# Patient Record
Sex: Male | Born: 1960 | Race: White | Hispanic: No | Marital: Married | Smoking: Never smoker
Health system: Southern US, Community
[De-identification: ages and names within clinical notes are randomized; demographics above are authoritative.]

## PROBLEM LIST (undated history)

## (undated) DIAGNOSIS — E785 Hyperlipidemia, unspecified: Secondary | ICD-10-CM

## (undated) DIAGNOSIS — M51369 Other intervertebral disc degeneration, lumbar region without mention of lumbar back pain or lower extremity pain: Secondary | ICD-10-CM

## (undated) DIAGNOSIS — K219 Gastro-esophageal reflux disease without esophagitis: Secondary | ICD-10-CM

## (undated) DIAGNOSIS — E559 Vitamin D deficiency, unspecified: Secondary | ICD-10-CM

## (undated) DIAGNOSIS — M109 Gout, unspecified: Secondary | ICD-10-CM

## (undated) DIAGNOSIS — M5126 Other intervertebral disc displacement, lumbar region: Secondary | ICD-10-CM

## (undated) DIAGNOSIS — M5136 Other intervertebral disc degeneration, lumbar region: Secondary | ICD-10-CM

## (undated) DIAGNOSIS — K589 Irritable bowel syndrome without diarrhea: Secondary | ICD-10-CM

## (undated) DIAGNOSIS — J309 Allergic rhinitis, unspecified: Secondary | ICD-10-CM

## (undated) HISTORY — PX: OTHER SURGICAL HISTORY: SHX169

## (undated) HISTORY — DX: Gout, unspecified: M10.9

## (undated) HISTORY — DX: Other intervertebral disc degeneration, lumbar region: M51.36

## (undated) HISTORY — DX: Irritable bowel syndrome, unspecified: K58.9

## (undated) HISTORY — DX: Gastro-esophageal reflux disease without esophagitis: K21.9

## (undated) HISTORY — DX: Allergic rhinitis, unspecified: J30.9

## (undated) HISTORY — DX: Other intervertebral disc displacement, lumbar region: M51.26

## (undated) HISTORY — DX: Hyperlipidemia, unspecified: E78.5

## (undated) HISTORY — PX: VASECTOMY: SHX75

## (undated) HISTORY — DX: Other intervertebral disc degeneration, lumbar region without mention of lumbar back pain or lower extremity pain: M51.369

## (undated) HISTORY — DX: Vitamin D deficiency, unspecified: E55.9

---

## 2003-05-15 ENCOUNTER — Ambulatory Visit (HOSPITAL_COMMUNITY): Admission: RE | Admit: 2003-05-15 | Discharge: 2003-05-16 | Payer: Self-pay | Admitting: Orthopaedic Surgery

## 2003-07-05 ENCOUNTER — Encounter: Admission: RE | Admit: 2003-07-05 | Discharge: 2003-08-20 | Payer: Self-pay | Admitting: Orthopaedic Surgery

## 2010-05-31 ENCOUNTER — Encounter: Payer: Self-pay | Admitting: Orthopedic Surgery

## 2015-10-14 DIAGNOSIS — M109 Gout, unspecified: Secondary | ICD-10-CM | POA: Diagnosis not present

## 2016-02-21 DIAGNOSIS — Z23 Encounter for immunization: Secondary | ICD-10-CM | POA: Diagnosis not present

## 2016-03-19 DIAGNOSIS — R8290 Unspecified abnormal findings in urine: Secondary | ICD-10-CM | POA: Diagnosis not present

## 2016-03-19 DIAGNOSIS — Z0000 Encounter for general adult medical examination without abnormal findings: Secondary | ICD-10-CM | POA: Diagnosis not present

## 2017-05-13 DIAGNOSIS — Z0000 Encounter for general adult medical examination without abnormal findings: Secondary | ICD-10-CM | POA: Diagnosis not present

## 2017-05-13 DIAGNOSIS — E7800 Pure hypercholesterolemia, unspecified: Secondary | ICD-10-CM | POA: Diagnosis not present

## 2017-05-13 DIAGNOSIS — Z23 Encounter for immunization: Secondary | ICD-10-CM | POA: Diagnosis not present

## 2017-05-13 DIAGNOSIS — E559 Vitamin D deficiency, unspecified: Secondary | ICD-10-CM | POA: Diagnosis not present

## 2017-05-13 DIAGNOSIS — Z125 Encounter for screening for malignant neoplasm of prostate: Secondary | ICD-10-CM | POA: Diagnosis not present

## 2017-05-13 DIAGNOSIS — R8290 Unspecified abnormal findings in urine: Secondary | ICD-10-CM | POA: Diagnosis not present

## 2019-04-13 DIAGNOSIS — Z20828 Contact with and (suspected) exposure to other viral communicable diseases: Secondary | ICD-10-CM | POA: Diagnosis not present

## 2020-01-31 LAB — CBC AND DIFFERENTIAL
HCT: 44 (ref 41–53)
Hemoglobin: 15.4 (ref 13.5–17.5)

## 2020-01-31 LAB — CBC: RBC: 5.11 (ref 3.87–5.11)

## 2020-01-31 LAB — TSH: TSH: 1.08 (ref <=5.90)

## 2020-03-03 ENCOUNTER — Encounter: Payer: Self-pay | Admitting: General Practice

## 2020-04-22 ENCOUNTER — Telehealth: Payer: Self-pay | Admitting: Radiology

## 2020-04-22 ENCOUNTER — Ambulatory Visit: Payer: 59 | Admitting: Cardiology

## 2020-04-22 ENCOUNTER — Encounter: Payer: Self-pay | Admitting: Cardiology

## 2020-04-22 ENCOUNTER — Other Ambulatory Visit: Payer: Self-pay

## 2020-04-22 VITALS — BP 110/70 | HR 69 | Ht 69.0 in | Wt 209.0 lb

## 2020-04-22 DIAGNOSIS — R002 Palpitations: Secondary | ICD-10-CM | POA: Diagnosis not present

## 2020-04-22 DIAGNOSIS — R079 Chest pain, unspecified: Secondary | ICD-10-CM

## 2020-04-22 DIAGNOSIS — Z8249 Family history of ischemic heart disease and other diseases of the circulatory system: Secondary | ICD-10-CM | POA: Diagnosis not present

## 2020-04-22 DIAGNOSIS — R072 Precordial pain: Secondary | ICD-10-CM | POA: Diagnosis not present

## 2020-04-22 MED ORDER — METOPROLOL TARTRATE 100 MG PO TABS: ORAL_TABLET | ORAL | 0 refills | Status: DC

## 2020-04-22 NOTE — Telephone Encounter (Signed)
Enrolled patient for a 30 day [employer] Event Monitor to be mailed to patients home  

## 2020-04-22 NOTE — Patient Instructions (Signed)
Medication Instructions:  Your physician recommends that you continue on your current medications as directed. Please refer to the Current Medication list given to you today.  *If you need a refill on your cardiac medications before your next appointment, please call your pharmacy*   Testing/Procedures: Your physician has requested that you have an echocardiogram. Echocardiography is a painless test that uses sound waves to create images of your heart. It provides your doctor with information about the size and shape of your heart and how well your heart's chambers and valves are working. This procedure takes approximately one hour. There are no restrictions for this procedure.  Your physician has recommended that you wear an event monitor. Event monitors are medical devices that record the heart's electrical activity. Doctors most often [place] these monitors to diagnose arrhythmias. Arrhythmias are problems with the speed or rhythm of the heartbeat. The monitor is a small, portable device. You can wear one while you do your normal daily activities. This is usually used to diagnose what is causing palpitations/syncope (passing out).  Your physician has recommended that you have a coronary CTA scan. Please see next page for further instructions.    Follow-Up: At [facility], you and your health needs are our priority.  As part of our continuing mission to provide you with exceptional heart care, we have created designated Provider Care Teams.  These Care Teams include your primary Cardiologist (physician) and Advanced Practice Providers (APPs -  Physician Assistants and Nurse Practitioners) who all work together to provide you with the care you need, when you need it.  Follow up with Dr. Radford Pax as needed based on results of testing.   Other Instructions Your cardiac CT will be scheduled at:   [facility] [address] [place], [place] [address] [phone]  Please arrive at  the [facility] main entrance of [facility] 30 minutes prior to test start time. Proceed to the [facility] Radiology Department (first floor) to check-in and test prep.   Please follow these instructions carefully (unless otherwise directed):  Hold all erectile dysfunction medications at least 3 days (72 hrs) prior to test.  On the Night Before the Test: . Be sure to Drink plenty of water. . Do not consume any caffeinated/decaffeinated beverages or chocolate 12 hours prior to your test. . Do not take any antihistamines 12 hours prior to your test.  On the Day of the Test: . Drink plenty of water. Do not drink any water within one hour of the test. . Do not eat any food 4 hours prior to the test. . You may take your regular medications prior to the test.  . Take metoprolol (Lopressor) two hours prior to test. . HOLD Furosemide/Hydrochlorothiazide morning of the test.  After the Test: . Drink plenty of water. . After receiving IV contrast, you may experience a mild flushed feeling. This is normal. . On occasion, you may experience a mild rash up to 24 hours after the test. This is not dangerous. If this occurs, you can take Benadryl 25 mg and increase your fluid intake. . If you experience trouble breathing, this can be serious. If it is severe call 911 IMMEDIATELY. If it is mild, please call our office. . If you take any of these medications: Glipizide/Metformin, Avandament, Glucavance, please do not take 48 hours after completing test unless otherwise instructed.   Once we have confirmed authorization from your insurance company, we will call you to set up a  date and time for your test. Based on how quickly your insurance processes prior authorizations requests, please allow up to 4 weeks to be contacted for scheduling your Cardiac CT appointment. Be advised that routine Cardiac CT appointments could be scheduled as many as 8 weeks after your provider has ordered it.  For  non-scheduling related questions, please contact the cardiac imaging nurse navigator should you have any questions/concerns: Marchia Bond, Cardiac Imaging Nurse Navigator Burley Saver, Interim Cardiac Imaging Nurse [place] [facility] Services Direct Office Dial: [phone]   For scheduling needs, including cancellations and rescheduling, please call [place], [phone].

## 2020-04-22 NOTE — Progress Notes (Signed)
Cardiology Office Note    Date:  04/22/2020   ID:  Spencer Medina, DOB Apr 28, 1961, MRN [id number]  PCP:  Hulan Fess, MD  Cardiologist:  Fransico Him, MD   Chief Complaint  Patient presents with  . New Patient (Initial Visit)    Chest pain and Palpitations    History of Present Illness:  Spencer Medina is a 59 y.o. male who is being seen today for the evaluation of chest pain and palpitations at the request of Little, Lennette Bihari, MD.  This is a 58yo male with a hx of GERD and HLD who is referred for evaluation of chest pain and palpitations. He tells me that he has had palpitations intermittently for the past few months.  His PCP thought it may be related to anxiety and placed him on Buspirone with some improvement in his symptoms.  Albin Fischer, though, while at work he had a severe episode of palpitations and HR jumped to 200bpm.  He felt lightheaded but no syncope.  He says that he has been under a lot of stress and thinks it may be playing a role.    He also has been having rare episodes of chest pain that he describes as a tightness over the left side but then at times feels like he has pulled a muscle.  There is no radiation of the discomfort and no associated Nausea or diaphoresis.  He denies any DOE, PND, orthopnea, LE edema.  He has never smoked but was exposed to second hand smoke growing up.  He thinks that his sister had heart valve problems and had to have surgery and thinks she has an AICD.  His mother has afib.    Past Medical History:  Diagnosis Date  . Allergic rhinitis   . DDD (degenerative disc disease), lumbar   . GERD (gastroesophageal reflux disease)   . Gout   . Hyperlipidemia   . IBS (irritable bowel syndrome)   . Lumbar herniated disc   . Vitamin D deficiency     Past Surgical History:  Procedure Laterality Date  . MICRODISCECTOMY    . VASECTOMY      Current Medications: Current Meds  Medication Sig  . atorvastatin (LIPITOR) 20 MG tablet Take 20 mg by  mouth daily.  . busPIRone (BUSPAR) 7.5 MG tablet Take 7.5 mg by mouth 2 (two) times daily.  Marland Kitchen CALCIUM CITRATE PO Take by mouth.  . Cholecalciferol (VITAMIN D3) 1.25 MG (50000 UT) TABS Take by mouth.  . Cyanocobalamin (VITAMIN B 12 PO) Take by mouth.  . fluticasone (FLONASE) 50 MCG/ACT nasal spray Place into both nostrils daily.  . Glucosamine 500 MG CAPS Take by mouth.  . vitamin E 180 MG (400 UNITS) capsule Take 400 Units by mouth daily.    Allergies:   Patient has no allergy information on record.   Social History   Socioeconomic History  . Marital status: Married    Spouse name: Not on file  . Number of children: Not on file  . Years of education: Not on file  . Highest education level: Not on file  Occupational History  . Not on file  Tobacco Use  . Smoking status: Never Smoker  . Smokeless tobacco: Never Used  Substance and Sexual Activity  . Alcohol use: Not on file  . Drug use: Not on file  . Sexual activity: Not on file  Other Topics Concern  . Not on file  Social History Narrative  . Not on file  Social Determinants of Health   Financial Resource Strain: Not on file  Food Insecurity: Not on file  Transportation Needs: Not on file  Physical Activity: Not on file  Stress: Not on file  Social Connections: Not on file     Family History:  The patient's family history includes Cancer in his father and mother; Heart Problems in his father and sister; High Cholesterol in his mother; Multiple myeloma in his father.   ROS:   Please see the history of present illness.    ROS All other systems reviewed and are negative.  No flowsheet data found.     PHYSICAL EXAM:   VS:  BP 110/70   Pulse 69   Ht $R'5\' 9"'xq$  (1.753 m)   Wt 209 lb (94.8 kg)   SpO2 97%   BMI 30.86 kg/m    GEN: Well nourished, well developed, in no acute distress  HEENT: normal  Neck: no JVD, carotid bruits, or masses Cardiac: RRR; no murmurs, rubs, or gallops,no edema.  Intact distal pulses  bilaterally.  Respiratory:  clear to auscultation bilaterally, normal work of breathing GI: soft, nontender, nondistended, + BS MS: no deformity or atrophy  Skin: warm and dry, no rash Neuro:  Alert and Oriented x 3, Strength and sensation are intact Psych: euthymic mood, full affect  Wt Readings from Last 3 Encounters:  04/22/20 209 lb (94.8 kg)      Studies/Labs Reviewed:   EKG:  EKG is ordered today.  The ekg ordered today demonstrates NSR with no ST changes  Recent Labs: No results found for requested labs within last 8760 hours.   Lipid Panel No results found for: CHOL, TRIG, HDL, CHOLHDL, VLDL, LDLCALC, LDLDIRECT Additional studies/ records that were reviewed today include:   OV notes from PCP    ASSESSMENT:    1. Chest pain of uncertain etiology   2. Palpitations   3. Family history of dilated cardiomyopathy   4. Precordial pain      PLAN:  In order of problems listed above:  1. Chest pain -atypical in some respects but he does have CRfs including second hand smoke exposure and HLD -EKG is nonischemic -I have recommended a coronary CTA to define coronary anatomy  2.  Palpitations -? Related to anxiety as Buspirone has helped some and he has been under a lot of stress -recommend 30 day event monitor to assess for arrhythmias  3.  Fm hx of DCM -his sister has an AICD -check 2D echo    Medication Adjustments/Labs and Tests Ordered: Current medicines are reviewed at length with the patient today.  Concerns regarding medicines are outlined above.  Medication changes, Labs and Tests ordered today are listed in the Patient Instructions below.  Patient Instructions  Medication Instructions:  Your physician recommends that you continue on your current medications as directed. Please refer to the Current Medication list given to you today.  *If you need a refill on your cardiac medications before your next appointment, please call your  pharmacy*   Testing/Procedures: Your physician has requested that you have an echocardiogram. Echocardiography is a painless test that uses sound waves to create images of your heart. It provides your doctor with information about the size and shape of your heart and how well your heart's chambers and valves are working. This procedure takes approximately one hour. There are no restrictions for this procedure.  Your physician has recommended that you wear an event monitor. Event monitors are medical devices that record  the heart's electrical activity. Doctors most often [place] these monitors to diagnose arrhythmias. Arrhythmias are problems with the speed or rhythm of the heartbeat. The monitor is a small, portable device. You can wear one while you do your normal daily activities. This is usually used to diagnose what is causing palpitations/syncope (passing out).  Your physician has recommended that you have a coronary CTA scan. Please see next page for further instructions.    Follow-Up: At [facility], you and your health needs are our priority.  As part of our continuing mission to provide you with exceptional heart care, we have created designated Provider Care Teams.  These Care Teams include your primary Cardiologist (physician) and Advanced Practice Providers (APPs -  Physician Assistants and Nurse Practitioners) who all work together to provide you with the care you need, when you need it.  Follow up with Dr. Radford Pax as needed based on results of testing.   Other Instructions Your cardiac CT will be scheduled at:   [facility] [address] [place], [place] [address] [phone]  Please arrive at the [facility] main entrance of [facility] 30 minutes prior to test start time. Proceed to the [facility] Radiology Department (first floor) to check-in and test prep.   Please follow these instructions carefully (unless otherwise directed):  Hold all erectile  dysfunction medications at least 3 days (72 hrs) prior to test.  On the Night Before the Test: . Be sure to Drink plenty of water. . Do not consume any caffeinated/decaffeinated beverages or chocolate 12 hours prior to your test. . Do not take any antihistamines 12 hours prior to your test.  On the Day of the Test: . Drink plenty of water. Do not drink any water within one hour of the test. . Do not eat any food 4 hours prior to the test. . You may take your regular medications prior to the test.  . Take metoprolol (Lopressor) two hours prior to test. . HOLD Furosemide/Hydrochlorothiazide morning of the test.  After the Test: . Drink plenty of water. . After receiving IV contrast, you may experience a mild flushed feeling. This is normal. . On occasion, you may experience a mild rash up to 24 hours after the test. This is not dangerous. If this occurs, you can take Benadryl 25 mg and increase your fluid intake. . If you experience trouble breathing, this can be serious. If it is severe call 911 IMMEDIATELY. If it is mild, please call our office. . If you take any of these medications: Glipizide/Metformin, Avandament, Glucavance, please do not take 48 hours after completing test unless otherwise instructed.   Once we have confirmed authorization from your insurance company, we will call you to set up a date and time for your test. Based on how quickly your insurance processes prior authorizations requests, please allow up to 4 weeks to be contacted for scheduling your Cardiac CT appointment. Be advised that routine Cardiac CT appointments could be scheduled as many as 8 weeks after your provider has ordered it.  For non-scheduling related questions, please contact the cardiac imaging nurse navigator should you have any questions/concerns: Marchia Bond, Cardiac Imaging Nurse Navigator Burley Saver, Interim Cardiac Imaging Nurse [place] and [facility] Services Direct Office  Dial: [phone]   For scheduling needs, including cancellations and rescheduling, please call [place], [phone].      Signed, Fransico Him, MD  04/22/2020 10:12 AM    [facility]  [place], [place], [place]  [address] Phone: [phone]; Fax: [phone]

## 2020-04-26 ENCOUNTER — Ambulatory Visit (INDEPENDENT_AMBULATORY_CARE_PROVIDER_SITE_OTHER): Payer: 59

## 2020-04-26 DIAGNOSIS — R079 Chest pain, unspecified: Secondary | ICD-10-CM

## 2020-04-26 DIAGNOSIS — R002 Palpitations: Secondary | ICD-10-CM

## 2020-04-26 DIAGNOSIS — R072 Precordial pain: Secondary | ICD-10-CM | POA: Diagnosis not present

## 2020-05-23 ENCOUNTER — Ambulatory Visit (HOSPITAL_COMMUNITY): Payer: 59 | Attending: Cardiovascular Disease

## 2020-05-23 ENCOUNTER — Other Ambulatory Visit: Payer: Self-pay

## 2020-05-23 DIAGNOSIS — R002 Palpitations: Secondary | ICD-10-CM | POA: Diagnosis not present

## 2020-05-23 DIAGNOSIS — R072 Precordial pain: Secondary | ICD-10-CM | POA: Diagnosis not present

## 2020-05-23 DIAGNOSIS — R079 Chest pain, unspecified: Secondary | ICD-10-CM | POA: Diagnosis not present

## 2020-05-23 LAB — ECHOCARDIOGRAM COMPLETE
Area-P 1/2: 2.71 cm2
S' Lateral: 3.1 cm

## 2020-06-05 ENCOUNTER — Telehealth (HOSPITAL_COMMUNITY): Payer: Self-pay | Admitting: *Deleted

## 2020-06-05 NOTE — Telephone Encounter (Signed)
Reaching out to patient to offer assistance regarding upcoming cardiac imaging study; pt verbalizes understanding of appt date/time, parking situation and where to check in, pre-test NPO status and medications ordered, and verified current allergies; name and call back number provided for further questions should they arise  Eleanna Theilen RN Navigator Cardiac Imaging [place] [facility] [phone] office [phone] cell  

## 2020-06-06 ENCOUNTER — Other Ambulatory Visit: Payer: Self-pay

## 2020-06-06 ENCOUNTER — Ambulatory Visit (HOSPITAL_COMMUNITY)
Admission: RE | Admit: 2020-06-06 | Discharge: 2020-06-06 | Disposition: A | Discharge status: Home / Self Care | Payer: 59 | Source: Ambulatory Visit | Attending: Cardiology | Admitting: Cardiology

## 2020-06-06 DIAGNOSIS — R072 Precordial pain: Secondary | ICD-10-CM | POA: Diagnosis not present

## 2020-06-06 MED ORDER — IOHEXOL 350 MG/ML SOLN
80.0000 mL | Freq: Once | INTRAVENOUS | Status: AC | PRN
  Administered 2020-06-06: 80 mL via INTRAVENOUS

## 2020-06-06 MED ORDER — NITROGLYCERIN 0.4 MG SL SUBL
0.8000 mg | SUBLINGUAL_TABLET | Freq: Once | SUBLINGUAL | Status: AC
  Administered 2020-06-06: 0.8 mg via SUBLINGUAL

## 2020-06-06 MED ORDER — NITROGLYCERIN 0.4 MG SL SUBL
SUBLINGUAL_TABLET | SUBLINGUAL | Status: AC
  Filled 2020-06-06: qty 2

## 2020-09-11 LAB — COMPREHENSIVE METABOLIC PANEL
Albumin: 4.5 (ref 3.5–5.0)
Calcium: 9.8 (ref 8.7–10.7)

## 2020-09-11 LAB — BASIC METABOLIC PANEL
BUN: 13 (ref 4–21)
CO2: 29 — AB (ref 13–22)
Glucose: 89
Potassium: 4.9 mEq/L (ref 3.5–5.1)

## 2020-09-11 LAB — HEPATIC FUNCTION PANEL
ALT: 23 U/L (ref 10–40)
AST: 23 (ref 14–40)

## 2021-04-17 ENCOUNTER — Other Ambulatory Visit: Payer: Self-pay | Admitting: Cardiology

## 2021-04-17 DIAGNOSIS — R002 Palpitations: Secondary | ICD-10-CM

## 2021-04-17 DIAGNOSIS — R072 Precordial pain: Secondary | ICD-10-CM

## 2021-04-17 DIAGNOSIS — R079 Chest pain, unspecified: Secondary | ICD-10-CM

## 2021-07-23 ENCOUNTER — Encounter: Payer: Self-pay | Admitting: Family Medicine

## 2021-07-31 ENCOUNTER — Other Ambulatory Visit: Payer: Self-pay

## 2021-07-31 ENCOUNTER — Encounter: Payer: Self-pay | Admitting: Family Medicine

## 2021-07-31 ENCOUNTER — Ambulatory Visit (INDEPENDENT_AMBULATORY_CARE_PROVIDER_SITE_OTHER): Payer: 59 | Admitting: Family Medicine

## 2021-07-31 VITALS — BP 110/68 | HR 88 | Temp 97.1°F | Ht 69.0 in | Wt 196.8 lb

## 2021-07-31 DIAGNOSIS — Z0000 Encounter for general adult medical examination without abnormal findings: Secondary | ICD-10-CM

## 2021-07-31 DIAGNOSIS — Z8739 Personal history of other diseases of the musculoskeletal system and connective tissue: Secondary | ICD-10-CM | POA: Diagnosis not present

## 2021-07-31 DIAGNOSIS — F419 Anxiety disorder, unspecified: Secondary | ICD-10-CM

## 2021-07-31 DIAGNOSIS — E7800 Pure hypercholesterolemia, unspecified: Secondary | ICD-10-CM | POA: Diagnosis not present

## 2021-07-31 DIAGNOSIS — Z1211 Encounter for screening for malignant neoplasm of colon: Secondary | ICD-10-CM

## 2021-07-31 MED ORDER — ATORVASTATIN CALCIUM 20 MG PO TABS: 20.0000 mg | ORAL_TABLET | Freq: Every day | ORAL | 3 refills | Status: DC

## 2021-07-31 MED ORDER — BUSPIRONE HCL 7.5 MG PO TABS: 7.5000 mg | ORAL_TABLET | Freq: Two times a day (BID) | ORAL | 2 refills | Status: DC

## 2021-07-31 NOTE — Progress Notes (Signed)
? ?Established Patient Office Visit ? ?Subjective:  ?Patient ID: Spencer Medina, male    DOB: 1960-06-10  Age: 61 y.o. MRN: [id number] ? ?CC:  ?Chief Complaint  ?Patient presents with  ? Establish Care  ?  NP Est. Care No concerns  ? ? ?HPI ?Spencer Medina presents for establishment of care and a refill for his cholesterol and anxiety medication.  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  He is taking BuSpar regularly twice daily for a few years and it is helped a great deal.  History of elevated cholesterol.  Apparently this runs in his family.  He is married.  He lives with his wife who is also my patient.  His father was a heavy smoker but died of bone cancer.  His mom died this past year at age 63 from what sounds like complications around temporal arteritis.  It sounds as though other factors were involved as well.  He was quite close to her.  History of gout with intermittent rare flares.  He takes colchicine when these occur.  Last colonoscopy was 10 years ago. ? ?Past Medical History:  ?Diagnosis Date  ? Allergic rhinitis   ? DDD (degenerative disc disease), lumbar   ? GERD (gastroesophageal reflux disease)   ? Gout   ? Hyperlipidemia   ? IBS (irritable bowel syndrome)   ? Lumbar herniated disc   ? Vitamin D deficiency   ? ? ?Past Surgical History:  ?Procedure Laterality Date  ? MICRODISCECTOMY    ? VASECTOMY    ? ? ?Family History  ?Problem Relation Age of Onset  ? High Cholesterol Mother   ? Cancer Mother   ?     BREAST  ? Cancer Father   ?     IN THE BLOOD  ? Multiple myeloma Father   ? Heart Problems Father   ? Heart Problems Sister   ? ? ?Social History  ? ?Socioeconomic History  ? Marital status: Married  ?  Spouse name: Not on file  ? Number of children: Not on file  ? Years of education: Not on file  ? Highest education level: Not on file  ?Occupational History  ? Not on file  ?Tobacco Use  ? Smoking status: Never  ? Smokeless tobacco: Never  ?Vaping Use  ? Vaping Use: Never used   ?Substance and Sexual Activity  ? Alcohol use: Not Currently  ? Drug use: Never  ? Sexual activity: Yes  ?Other Topics Concern  ? Not on file  ?Social History Narrative  ? Not on file  ? ?Social Determinants of Health  ? ?Financial Resource Strain: Not on file  ?Food Insecurity: Not on file  ?Transportation Needs: Not on file  ?Physical Activity: Not on file  ?Stress: Not on file  ?Social Connections: Not on file  ?Intimate Partner Violence: Not on file  ? ? ?Outpatient Medications Prior to Visit  ?Medication Sig Dispense Refill  ? Cholecalciferol (VITAMIN D3) 1.25 MG (50000 UT) TABS Take by mouth.    ? Cyanocobalamin (VITAMIN B 12 PO) Take by mouth.    ? fluticasone (FLONASE) 50 MCG/ACT nasal spray Place into both nostrils daily.    ? Glucosamine 500 MG CAPS Take by mouth.    ? metoprolol tartrate (LOPRESSOR) 100 MG tablet Take 1 tablet (100 mg total) two hours prior to CT scan. 1 tablet 0  ? vitamin E 180 MG (400 UNITS) capsule Take 400 Units by mouth daily.    ?  atorvastatin (LIPITOR) 20 MG tablet Take 20 mg by mouth daily.    ? busPIRone (BUSPAR) 7.5 MG tablet Take 7.5 mg by mouth 2 (two) times daily.    ? CALCIUM CITRATE PO Take by mouth. (Patient not taking: Reported on 06/06/2020)    ? ?No facility-administered medications prior to visit.  ? ? ?Allergies  ?Allergen Reactions  ? Shellfish-Derived Products Other (See Comments)  ? ? ?ROS ?Review of Systems  ?Constitutional:  Negative for diaphoresis, fatigue, fever and unexpected weight change.  ?HENT: Negative.    ?Eyes:  Negative for photophobia.  ?Respiratory: Negative.    ?Cardiovascular: Negative.   ?Gastrointestinal: Negative.   ?Endocrine: Negative for polyphagia and polyuria.  ?Genitourinary: Negative.   ?Musculoskeletal:  Negative for gait problem and joint swelling.  ?Neurological:  Negative for speech difficulty and weakness.  ? ?  ? ?  07/31/2021  ?  4:15 PM 07/31/2021  ?  3:50 PM  ?Depression screen PHQ 2/9  ?Down, Depressed, Hopeless 0 0  ?PHQ - 2  Score 0 0  ?Altered sleeping 0   ?Tired, decreased energy 0   ?Change in appetite 0   ?Feeling bad or failure about yourself  0   ?Trouble concentrating 0   ?Moving slowly or fidgety/restless 0   ?Suicidal thoughts 0   ?PHQ-9 Score 0   ?Difficult doing work/chores Not difficult at all   ? ? ? ?Objective:  ?  ?Physical Exam ?Vitals and nursing note reviewed.  ?Constitutional:   ?   General: He is not in acute distress. ?   Appearance: Normal appearance. He is not ill-appearing, toxic-appearing or diaphoretic.  ?HENT:  ?   Head: Normocephalic and atraumatic.  ?   Right Ear: External ear normal.  ?   Left Ear: External ear normal.  ?Eyes:  ?   General: No scleral icterus.    ?   Right eye: No discharge.     ?   Left eye: No discharge.  ?   Extraocular Movements: Extraocular movements intact.  ?   Conjunctiva/sclera: Conjunctivae normal.  ?Cardiovascular:  ?   Rate and Rhythm: Normal rate and regular rhythm.  ?Pulmonary:  ?   Effort: Pulmonary effort is normal.  ?   Breath sounds: Normal breath sounds.  ?Neurological:  ?   Mental Status: He is alert and oriented to person, place, and time.  ?Psychiatric:     ?   Mood and Affect: Mood normal.     ?   Behavior: Behavior normal.  ? ? ?BP 110/68 (BP Location: Left Arm, Patient Position: Sitting, Cuff Size: Normal)   Pulse 88   Temp (!) 97.1 ?F (36.2 ?C) (Temporal)   Ht 5' 9"  (1.753 m)   Wt 196 lb 12.8 oz (89.3 kg)   SpO2 96%   BMI 29.06 kg/m?  ?Wt Readings from Last 3 Encounters:  ?07/31/21 196 lb 12.8 oz (89.3 kg)  ?04/22/20 209 lb (94.8 kg)  ? ? ? ?Health Maintenance Due  ?Topic Date Due  ? HIV Screening  Never done  ? Hepatitis C Screening  Never done  ? ? ?There are no preventive care reminders to display for this patient. ? ?Lab Results  ?Component Value Date  ? TSH 1.08 01/31/2020  ? ?Lab Results  ?Component Value Date  ? WBC 6.4 01/31/2020  ? HGB 15.4 01/31/2020  ? HCT 44 01/31/2020  ? PLT 252 01/31/2020  ? ?Lab Results  ?Component Value Date  ? NA 136 (A)  09/11/2020  ?  K 4.9 09/11/2020  ? CO2 29 (A) 09/11/2020  ? BUN 13 09/11/2020  ? AST 23 09/11/2020  ? ALT 23 09/11/2020  ? ALBUMIN 4.5 09/11/2020  ? CALCIUM 9.8 09/11/2020  ? EGFR 76 09/11/2020  ? ?Lab Results  ?Component Value Date  ? CHOL 178 09/11/2020  ? ?Lab Results  ?Component Value Date  ? HDL 54 09/11/2020  ? ?Lab Results  ?Component Value Date  ? [place] 111 09/11/2020  ? ?Lab Results  ?Component Value Date  ? TRIG 71 09/11/2020  ? ?No results found for: CHOLHDL ?No results found for: HGBA1C ? ?  ?Assessment & Plan:  ? ?Problem List Items Addressed This Visit   ?None ?Visit Diagnoses   ? ? Healthcare maintenance    -  Primary  ? Relevant Orders  ? CBC  ? Comprehensive metabolic panel  ? PSA  ? Urinalysis, Routine w reflex microscopic  ? Elevated cholesterol      ? Relevant Medications  ? atorvastatin (LIPITOR) 20 MG tablet  ? Other Relevant Orders  ? Comprehensive metabolic panel  ? Lipid panel  ? Anxiety      ? Relevant Medications  ? busPIRone (BUSPAR) 7.5 MG tablet  ? History of gout      ? Relevant Orders  ? Uric acid  ? Screen for colon cancer      ? Relevant Orders  ? Ambulatory referral to Gastroenterology  ? ?  ? ? ?Meds ordered this encounter  ?Medications  ? busPIRone (BUSPAR) 7.5 MG tablet  ?  Sig: Take 1 tablet (7.5 mg total) by mouth 2 (two) times daily.  ?  Dispense:  180 tablet  ?  Refill:  2  ? atorvastatin (LIPITOR) 20 MG tablet  ?  Sig: Take 1 tablet (20 mg total) by mouth daily.  ?  Dispense:  90 tablet  ?  Refill:  3  ? ? ?Follow-up: Return in about 3 months (around 10/31/2021), or Return fasting for blood work and physical exam..  ? ? ?Libby Maw, MD ?

## 2021-08-10 ENCOUNTER — Encounter: Payer: Self-pay | Admitting: Gastroenterology

## 2021-08-11 DIAGNOSIS — Z8739 Personal history of other diseases of the musculoskeletal system and connective tissue: Secondary | ICD-10-CM | POA: Insufficient documentation

## 2021-08-11 DIAGNOSIS — M109 Gout, unspecified: Secondary | ICD-10-CM | POA: Insufficient documentation

## 2021-08-11 DIAGNOSIS — K219 Gastro-esophageal reflux disease without esophagitis: Secondary | ICD-10-CM | POA: Insufficient documentation

## 2021-08-11 DIAGNOSIS — E559 Vitamin D deficiency, unspecified: Secondary | ICD-10-CM | POA: Insufficient documentation

## 2021-08-11 DIAGNOSIS — M51369 Other intervertebral disc degeneration, lumbar region without mention of lumbar back pain or lower extremity pain: Secondary | ICD-10-CM | POA: Insufficient documentation

## 2021-08-11 DIAGNOSIS — J309 Allergic rhinitis, unspecified: Secondary | ICD-10-CM | POA: Insufficient documentation

## 2021-08-11 DIAGNOSIS — F4320 Adjustment disorder, unspecified: Secondary | ICD-10-CM | POA: Insufficient documentation

## 2021-08-11 DIAGNOSIS — M5136 Other intervertebral disc degeneration, lumbar region: Secondary | ICD-10-CM | POA: Insufficient documentation

## 2021-08-11 DIAGNOSIS — E7800 Pure hypercholesterolemia, unspecified: Secondary | ICD-10-CM | POA: Insufficient documentation

## 2021-08-19 ENCOUNTER — Ambulatory Visit (AMBULATORY_SURGERY_CENTER): Payer: 59 | Admitting: *Deleted

## 2021-08-19 VITALS — Ht 69.0 in | Wt 194.0 lb

## 2021-08-19 DIAGNOSIS — Z1211 Encounter for screening for malignant neoplasm of colon: Secondary | ICD-10-CM

## 2021-08-19 MED ORDER — CLENPIQ 10-3.5-12 MG-GM -GM/160ML PO SOLN: 1.0000 | ORAL | 0 refills | Status: DC

## 2021-08-19 NOTE — Progress Notes (Signed)
No egg or soy allergy known to patient  ?No issues known to pt with past sedation with any surgeries or procedures ?Patient denies ever being told they had issues or difficulty with intubation  ?No FH of Malignant Hyperthermia ?Pt is not on diet pills ?Pt is not on  home 02  ?Pt is not on blood thinners  ?Pt denies issues with constipation  ?No A fib or A flutter ? ?Clenpiq Coupon to pt in PV today , Code to Pharmacy and  NO PA's for preps discussed with pt In PV today  ?Discussed with pt there will be an out-of-pocket cost for prep and that varies from $0 to 70 +  dollars - pt verbalized understanding  ? ?Due to the COVID-19 pandemic we are asking patients to follow certain guidelines in PV and the [facility]   ?Pt aware of COVID protocols and [facility] guidelines  ? ?PV completed over the phone. Pt verified name, DOB, address and insurance during PV today.  ? ?Pt encouraged to call with questions or issues.  ?If pt has My chart, procedure instructions sent via My Chart   ?

## 2021-09-11 ENCOUNTER — Encounter: Payer: Self-pay | Admitting: Gastroenterology

## 2021-09-18 ENCOUNTER — Ambulatory Visit (AMBULATORY_SURGERY_CENTER): Payer: 59 | Admitting: Gastroenterology

## 2021-09-18 ENCOUNTER — Encounter: Payer: Self-pay | Admitting: Gastroenterology

## 2021-09-18 VITALS — BP 105/76 | HR 61 | Temp 97.3°F | Resp 12 | Wt 194.0 lb

## 2021-09-18 DIAGNOSIS — Z1211 Encounter for screening for malignant neoplasm of colon: Secondary | ICD-10-CM

## 2021-09-18 DIAGNOSIS — K5730 Diverticulosis of large intestine without perforation or abscess without bleeding: Secondary | ICD-10-CM

## 2021-09-18 MED ORDER — SODIUM CHLORIDE 0.9 % IV SOLN: 500.0000 mL | Freq: Once | INTRAVENOUS | Status: DC

## 2021-09-18 NOTE — Op Note (Signed)
[facility] ?Patient Name: Spencer Medina ?Procedure Date: 09/18/2021 8:55 AM ?MRN: [id number] ?Endoscopist: Gerrit Heck , MD ?Age: 61 ?Referring MD:  ?Date of Birth: [birth year] ?Gender: Male ?Account #: [id number] ?Procedure:                Colonoscopy ?Indications:              Screening for colorectal malignant neoplasm. Last  ?                          colonoscopy was 10 years ago and normal/no polyps.  ?                          He is otherwise without GI symptoms and no FHx of  ?                          colon cancer. ?Medicines:                Monitored Anesthesia Care ?Procedure:                Pre-Anesthesia Assessment: ?                          - Prior to the procedure, a History and Physical  ?                          was performed, and patient medications and  ?                          allergies were reviewed. The patient's tolerance of  ?                          previous anesthesia was also reviewed. The risks  ?                          and benefits of the procedure and the sedation  ?                          options and risks were discussed with the patient.  ?                          All questions were answered, and informed consent  ?                          was obtained. Prior Anticoagulants: The patient has  ?                          taken no previous anticoagulant or antiplatelet  ?                          agents. ASA Grade Assessment: II - A patient with  ?                          mild systemic disease. After reviewing the risks  ?  and benefits, the patient was deemed in  ?                          satisfactory condition to undergo the procedure. ?                          After obtaining informed consent, the colonoscope  ?                          was passed under direct vision. Throughout the  ?                          procedure, the patient's blood pressure, pulse, and  ?                          oxygen saturations were monitored continuously.  The  ?                          CF [id number] was introduced through the anus  ?                          and advanced to the the terminal ileum. The  ?                          colonoscopy was performed without difficulty. The  ?                          patient tolerated the procedure well. The quality  ?                          of the bowel preparation was good. The terminal  ?                          ileum, ileocecal valve, appendiceal orifice, and  ?                          rectum were photographed. ?Scope In: 9:08:58 AM ?Scope Out: 9:22:35 AM ?Scope Withdrawal Time: 0 hours 10 minutes 45 seconds  ?Total Procedure Duration: 0 hours 13 minutes 37 seconds  ?Findings:                 The perianal and digital rectal examinations were  ?                          normal. ?                          A few small-mouthed diverticula were found in the  ?                          sigmoid colon. ?                          The exam was otherwise normal throughout the  ?  remainder of the colon. ?                          The retroflexed view of the distal rectum and anal  ?                          verge was normal and showed no anal or rectal  ?                          abnormalities. ?                          The terminal ileum appeared normal. ?Complications:            No immediate complications. ?Estimated Blood Loss:     Estimated blood loss: none. ?Impression:               - Diverticulosis in the sigmoid colon. ?                          - The distal rectum and anal verge are normal on  ?                          retroflexion view. ?                          - The examined portion of the ileum was normal. ?                          - No specimens collected. ?Recommendation:           - Patient has a contact number available for  ?                          emergencies. The signs and symptoms of potential  ?                          delayed complications were discussed with the  ?                           patient. Return to normal activities tomorrow.  ?                          Written discharge instructions were provided to the  ?                          patient. ?                          - Resume previous diet. ?                          - Continue present medications. ?                          - Repeat colonoscopy in 10 years for screening  ?  purposes. ?                          - Return to GI office PRN. ?Gerrit Heck, MD ?09/18/2021 9:26:49 AM ?

## 2021-09-18 NOTE — Progress Notes (Signed)
? ?GASTROENTEROLOGY PROCEDURE H&P NOTE  ? ?Primary Care Physician: ?Libby Maw, MD ? ? ? ?Reason for Procedure:   Colon cancer screening  ? ?Plan:    Colonoscopy ? ?Patient is appropriate for endoscopic procedure(s) in the ambulatory ([facility]) setting. ? ?The nature of the procedure, as well as the risks, benefits, and alternatives were carefully and thoroughly reviewed with the patient. Ample time for discussion and questions allowed. The patient understood, was satisfied, and agreed to proceed.  ? ? ? ?HPI: ?Spencer Medina is a 61 y.o. male who presents for colonoscopy for CRC screening. ? ?Past Medical History:  ?Diagnosis Date  ? Allergic rhinitis   ? DDD (degenerative disc disease), lumbar   ? GERD (gastroesophageal reflux disease)   ? Gout   ? Hyperlipidemia   ? IBS (irritable bowel syndrome)   ? Lumbar herniated disc   ? Vitamin D deficiency   ? ? ?Past Surgical History:  ?Procedure Laterality Date  ? MICRODISCECTOMY    ? VASECTOMY    ? ? ?Prior to Admission medications   ?Medication Sig Start Date End Date Taking? Authorizing Provider  ?atorvastatin (LIPITOR) 20 MG tablet Take 1 tablet (20 mg total) by mouth daily. 07/31/21  Yes Libby Maw, MD  ?busPIRone (BUSPAR) 7.5 MG tablet Take 1 tablet (7.5 mg total) by mouth 2 (two) times daily. 07/31/21  Yes Libby Maw, MD  ?Cholecalciferol (VITAMIN D3) 1.25 MG (50000 UT) TABS Take by mouth.   Yes [provider]  ?Cyanocobalamin (VITAMIN B 12 PO) Take by mouth.   Yes [provider]  ?fluticasone (FLONASE) 50 MCG/ACT nasal spray Place into both nostrils daily.   Yes [provider]  ?Glucosamine-Chondroit-Vit C-Mn (GLUCOSAMINE 1500 COMPLEX) CAPS See admin instructions.   Yes [provider]  ?vitamin E 180 MG (400 UNITS) capsule Take 400 Units by mouth daily.   Yes [provider]  ?CALCIUM CITRATE PO Take by mouth. ?Patient not taking: Reported on 06/06/2020    [provider]   ?Glucosamine 500 MG CAPS Take by mouth. ?Patient not taking: Reported on 08/19/2021    [provider]  ?metoprolol tartrate (LOPRESSOR) 100 MG tablet Take 1 tablet (100 mg total) two hours prior to CT scan. ?Patient not taking: Reported on 08/19/2021 04/22/20   Sueanne Margarita, MD  ? ? ?Current Outpatient Medications  ?Medication Sig Dispense Refill  ? atorvastatin (LIPITOR) 20 MG tablet Take 1 tablet (20 mg total) by mouth daily. 90 tablet 3  ? busPIRone (BUSPAR) 7.5 MG tablet Take 1 tablet (7.5 mg total) by mouth 2 (two) times daily. 180 tablet 2  ? Cholecalciferol (VITAMIN D3) 1.25 MG (50000 UT) TABS Take by mouth.    ? Cyanocobalamin (VITAMIN B 12 PO) Take by mouth.    ? fluticasone (FLONASE) 50 MCG/ACT nasal spray Place into both nostrils daily.    ? Glucosamine-Chondroit-Vit C-Mn (GLUCOSAMINE 1500 COMPLEX) CAPS See admin instructions.    ? vitamin E 180 MG (400 UNITS) capsule Take 400 Units by mouth daily.    ? CALCIUM CITRATE PO Take by mouth. (Patient not taking: Reported on 06/06/2020)    ? Glucosamine 500 MG CAPS Take by mouth. (Patient not taking: Reported on 08/19/2021)    ? metoprolol tartrate (LOPRESSOR) 100 MG tablet Take 1 tablet (100 mg total) two hours prior to CT scan. (Patient not taking: Reported on 08/19/2021) 1 tablet 0  ? ?Current Facility-Administered Medications  ?Medication Dose Route Frequency Provider Last Rate Last  Admin  ? 0.9 %  sodium chloride infusion  500 mL Intravenous Once Zuly Belkin V, DO      ? ? ?Allergies as of 09/18/2021 - Review Complete 09/18/2021  ?Allergen Reaction Noted  ? Shellfish-derived products Other (See Comments) 01/31/2020  ? ? ?Family History  ?Problem Relation Age of Onset  ? High Cholesterol Mother   ? Cancer Mother   ?     BREAST  ? Cancer Father   ?     IN THE BLOOD  ? Multiple myeloma Father   ? Heart Problems Father   ? Heart Problems Sister   ? Colon cancer Neg Hx   ? Colon polyps Neg Hx   ? Esophageal cancer Neg Hx   ? Stomach cancer Neg Hx    ? Rectal cancer Neg Hx   ? ? ?Social History  ? ?Socioeconomic History  ? Marital status: Married  ?  Spouse name: Not on file  ? Number of children: Not on file  ? Years of education: Not on file  ? Highest education level: Not on file  ?Occupational History  ? Not on file  ?Tobacco Use  ? Smoking status: Never  ? Smokeless tobacco: Never  ?Vaping Use  ? Vaping Use: Never used  ?Substance and Sexual Activity  ? Alcohol use: Not Currently  ? Drug use: Never  ? Sexual activity: Yes  ?Other Topics Concern  ? Not on file  ?Social History Narrative  ? Not on file  ? ?Social Determinants of Health  ? ?Financial Resource Strain: Not on file  ?Food Insecurity: Not on file  ?Transportation Needs: Not on file  ?Physical Activity: Not on file  ?Stress: Not on file  ?Social Connections: Not on file  ?Intimate Partner Violence: Not on file  ? ? ?Physical Exam: ?Vital signs in last 24 hours: ?@BP  123/76   Pulse 88   Temp (!) 97.3 ?F (36.3 ?C)   Wt 194 lb (88 kg)   SpO2 99%   BMI 28.65 kg/m?  ?GEN: NAD ?EYE: Sclerae anicteric ?ENT: MMM ?CV: Non-tachycardic ?Pulm: CTA b/l ?GI: Soft, NT/ND ?NEURO:  Alert & Oriented x 3 ? ? ?Gerrit Heck, DO ?[facility] ? ? ?09/18/2021 8:58 AM ? ?

## 2021-09-18 NOTE — Progress Notes (Signed)
To pacu, VSS. Report to [email or web address].tb 

## 2021-09-18 NOTE — Patient Instructions (Signed)
YOU HAD AN ENDOSCOPIC PROCEDURE TODAY AT THE [facility]:   Refer to the procedure report that was given to you for any specific questions about what was found during the examination.  If the procedure report does not answer your questions, please call your gastroenterologist to clarify.  If you requested that your care partner not be given the details of your procedure findings, then the procedure report has been included in a sealed envelope for you to review at your convenience later.  YOU SHOULD EXPECT: Some feelings of bloating in the abdomen. Passage of more gas than usual.  Walking can help get rid of the air that was put into your GI tract during the procedure and reduce the bloating. If you had a lower endoscopy (such as a colonoscopy or flexible sigmoidoscopy) you may notice spotting of blood in your stool or on the toilet paper. If you underwent a bowel prep for your procedure, you may not have a normal bowel movement for a few days.  Please Note:  You might notice some irritation and congestion in your nose or some drainage.  This is from the oxygen used during your procedure.  There is no need for concern and it should clear up in a day or so.  SYMPTOMS TO REPORT IMMEDIATELY:   Following lower endoscopy (colonoscopy or flexible sigmoidoscopy):  Excessive amounts of blood in the stool  Significant tenderness or worsening of abdominal pains  Swelling of the abdomen that is new, acute  Fever of 100F or higher  For urgent or emergent issues, a gastroenterologist can be reached at any hour by calling [phone]. Do not use MyChart messaging for urgent concerns.    DIET:  We do recommend a small meal at first, but then you may proceed to your regular diet.  Drink plenty of fluids but you should avoid alcoholic beverages for 24 hours.  ACTIVITY:  You should plan to take it easy for the rest of today and you should NOT DRIVE or use heavy machinery until tomorrow (because  of the sedation medicines used during the test).    FOLLOW UP: Our staff will call the number listed on your records 48-72 hours following your procedure to check on you and address any questions or concerns that you may have regarding the information given to you following your procedure. If we do not reach you, we will leave a message.  We will attempt to reach you two times.  During this call, we will ask if you have developed any symptoms of COVID 19. If you develop any symptoms (ie: fever, flu-like symptoms, shortness of breath, cough etc.) before then, please call [phone].  If you test positive for Covid 19 in the 2 weeks post procedure, please call and report this information to us.    If any biopsies were taken you will be contacted by phone or by letter within the next 1-3 weeks.  Please call us at [phone] if you have not heard about the biopsies in 3 weeks.    SIGNATURES/CONFIDENTIALITY: You and/or your care partner have signed paperwork which will be entered into your electronic medical record.  These signatures attest to the fact that that the information above on your After Visit Summary has been reviewed and is understood.  Full responsibility of the confidentiality of this discharge information lies with you and/or your care-partner. 

## 2021-09-18 NOTE — Progress Notes (Signed)
Pt's states no medical or surgical changes since previsit or office visit.  ° °VS AS °

## 2021-09-22 ENCOUNTER — Telehealth: Payer: Self-pay

## 2021-09-22 NOTE — Telephone Encounter (Signed)
?  Follow up Call- ? ? ?  09/18/2021  ?  8:13 AM  ?Call back number  ?Post procedure Call Back phone  # [phone]  ?Permission to leave phone message Yes  ?  ? ?Patient questions: ? ?Do you have a fever, pain , or abdominal swelling? No. ?Pain Score  0 * ? ?Have you tolerated food without any problems? Yes.   ? ?Have you been able to return to your normal activities? Yes.   ? ?Do you have any questions about your discharge instructions: ?Diet   No. ?Medications  No. ?Follow up visit  No. ? ?Do you have questions or concerns about your Care? No. ? ?Actions: ?* If pain score is 4 or above: ?No action needed, pain <4. ? ? ?

## 2021-10-15 ENCOUNTER — Ambulatory Visit (INDEPENDENT_AMBULATORY_CARE_PROVIDER_SITE_OTHER): Payer: 59 | Admitting: Family Medicine

## 2021-10-15 ENCOUNTER — Encounter: Payer: Self-pay | Admitting: Family Medicine

## 2021-10-15 VITALS — BP 118/74 | HR 77 | Temp 97.6°F | Ht 69.0 in | Wt 194.6 lb

## 2021-10-15 DIAGNOSIS — Z0000 Encounter for general adult medical examination without abnormal findings: Secondary | ICD-10-CM | POA: Insufficient documentation

## 2021-10-15 DIAGNOSIS — E7800 Pure hypercholesterolemia, unspecified: Secondary | ICD-10-CM

## 2021-10-15 DIAGNOSIS — Z8739 Personal history of other diseases of the musculoskeletal system and connective tissue: Secondary | ICD-10-CM | POA: Diagnosis not present

## 2021-10-15 DIAGNOSIS — K649 Unspecified hemorrhoids: Secondary | ICD-10-CM

## 2021-10-15 LAB — URINALYSIS, ROUTINE W REFLEX MICROSCOPIC
Bilirubin Urine: NEGATIVE
Hgb urine dipstick: NEGATIVE
Ketones, ur: NEGATIVE
Leukocytes,Ua: NEGATIVE
Nitrite: NEGATIVE
RBC / HPF: NONE SEEN (ref 0–?)
Specific Gravity, Urine: <=1.005 — AB (ref 1.000–1.030)
Total Protein, Urine: NEGATIVE
Urine Glucose: NEGATIVE
Urobilinogen, UA: 0.2 (ref 0.0–1.0)
WBC, UA: NONE SEEN (ref 0–?)
pH: 6.5 (ref 5.0–8.0)

## 2021-10-15 LAB — CBC
HCT: 43.9 % (ref 39.0–52.0)
Hemoglobin: 14.8 g/dL (ref 13.0–17.0)
MCHC: 33.8 g/dL (ref 30.0–36.0)
MCV: 86.1 fl (ref 78.0–100.0)
Platelets: 202 10*3/uL (ref 150.0–400.0)
RBC: 5.1 Mil/uL (ref 4.22–5.81)
RDW: 13.1 % (ref 11.5–15.5)
WBC: 4.9 10*3/uL (ref 4.0–10.5)

## 2021-10-15 LAB — COMPREHENSIVE METABOLIC PANEL
ALT: 20 U/L (ref 0–53)
AST: 20 U/L (ref 0–37)
Albumin: 4.4 g/dL (ref 3.5–5.2)
Alkaline Phosphatase: 45 U/L (ref 39–117)
BUN: 15 mg/dL (ref 6–23)
CO2: 24 mEq/L (ref 19–32)
Calcium: 9.4 mg/dL (ref 8.4–10.5)
Chloride: 103 mEq/L (ref 96–112)
Creatinine, Ser: 0.98 mg/dL (ref 0.40–1.50)
GFR: 83.89 mL/min (ref >60.00)
Glucose, Bld: 89 mg/dL (ref 70–99)
Potassium: 4.2 mEq/L (ref 3.5–5.1)
Sodium: 136 mEq/L (ref 135–145)
Total Bilirubin: 0.9 mg/dL (ref 0.2–1.2)
Total Protein: 7.8 g/dL (ref 6.0–8.3)

## 2021-10-15 LAB — LIPID PANEL
Cholesterol: 182 mg/dL (ref 0–200)
HDL: 53.8 mg/dL (ref >39.00)
LDL Cholesterol: 109 mg/dL — ABNORMAL HIGH (ref 0–99)
NonHDL: 128.48
Total CHOL/HDL Ratio: 3
Triglycerides: 95 mg/dL (ref 0.0–149.0)
VLDL: 19 mg/dL (ref 0.0–40.0)

## 2021-10-15 LAB — URIC ACID: Uric Acid, Serum: 8.3 mg/dL — ABNORMAL HIGH (ref 4.0–7.8)

## 2021-10-15 LAB — PSA: PSA: 0.51 ng/mL (ref 0.10–4.00)

## 2021-10-15 MED ORDER — HYDROCORTISONE ACE-PRAMOXINE 1-1 % EX CREA: 1.0000 "application " | TOPICAL_CREAM | Freq: Two times a day (BID) | CUTANEOUS | 0 refills | Status: DC

## 2021-10-15 NOTE — Progress Notes (Addendum)
Established Patient Office Visit  Subjective   Patient ID: Spencer Medina, male    DOB: 12/16/1960  Age: 61 y.o. MRN: [id number]  Chief Complaint  Patient presents with   Annual Exam    CPE/Labs.  Fasting today.  No concerns.     HPI presents for physical exam and is fasting.  Doing well.  Exercising regularly does all a lot of walking at work.  Achieves up to 15,000 steps daily.  Has regular dental and eye care.  He is married and in a stable relationship.  Recent colonoscopy was essentially normal with a few diverticula.  He is concerned about a possible hemorrhoid.  Denies constipation or prolonged sitting on the toilet bowl.    Review of Systems  Constitutional: Negative.   HENT: Negative.    Eyes:  Negative for blurred vision, discharge and redness.  Respiratory: Negative.    Cardiovascular: Negative.   Gastrointestinal:  Negative for abdominal pain.  Genitourinary: Negative.   Musculoskeletal: Negative.  Negative for myalgias.  Skin:  Negative for rash.  Neurological:  Negative for tingling, loss of consciousness and weakness.  Endo/Heme/Allergies:  Negative for polydipsia.      Objective:     BP 118/74   Pulse 77   Temp 97.6 F (36.4 C) (Temporal)   Ht 5\' 9"  (1.753 m)   Wt 194 lb 9.6 oz (88.3 kg)   SpO2 97%   BMI 28.74 kg/m    Physical Exam Constitutional:      General: He is not in acute distress.    Appearance: Normal appearance. He is not ill-appearing, toxic-appearing or diaphoretic.  HENT:     Head: Normocephalic and atraumatic.     Right Ear: External ear normal.     Left Ear: External ear normal.     Mouth/Throat:     Mouth: Mucous membranes are moist.     Pharynx: Oropharynx is clear. No oropharyngeal exudate or posterior oropharyngeal erythema.  Eyes:     General: No scleral icterus.       Right eye: No discharge.        Left eye: No discharge.     Extraocular Movements: Extraocular movements intact.     Conjunctiva/sclera: Conjunctivae  normal.     Pupils: Pupils are equal, round, and reactive to light.  Cardiovascular:     Rate and Rhythm: Normal rate and regular rhythm.  Pulmonary:     Effort: Pulmonary effort is normal. No respiratory distress.     Breath sounds: Normal breath sounds.  Abdominal:     General: Bowel sounds are normal.     Tenderness: There is no abdominal tenderness. There is no guarding.     Hernia: There is no hernia in the left inguinal area or right inguinal area.  Genitourinary:    Penis: No hypospadias, erythema, tenderness, discharge, swelling or lesions.      Testes:        Right: Mass, tenderness or swelling not present. Right testis is descended.        Left: Mass, tenderness or swelling not present. Left testis is descended.     Epididymis:     Right: Not inflamed or enlarged.     Left: Not inflamed or enlarged.    Musculoskeletal:     Cervical back: No rigidity or tenderness.  Lymphadenopathy:     Lower Body: No right inguinal adenopathy. No left inguinal adenopathy.  Skin:    General: Skin is warm and dry.  Neurological:  Mental Status: He is alert and oriented to person, place, and time.  Psychiatric:        Mood and Affect: Mood normal.        Behavior: Behavior normal.      No results found for any visits on 10/15/21.    The 10-year ASCVD risk score (Arnett DK, et al., 2019) is: 6.4%* (Cholesterol units were assumed)    Assessment & Plan:   Problem List Items Addressed This Visit       Cardiovascular and Mediastinum   Hemorrhoids   Relevant Medications   pramoxine-hydrocortisone (PROCTOCREAM-HC) 1-1 % rectal cream     Other   History of gout   Relevant Orders   Comprehensive metabolic panel   Uric acid   Elevated cholesterol   Relevant Orders   Comprehensive metabolic panel   Lipid panel   Healthcare maintenance - Primary   Relevant Orders   CBC   PSA   Urinalysis, Routine w reflex microscopic    Return in about 1 year (around 10/16/2022), or if  symptoms worsen or fail to improve.  Information was given about health maintenance and disease prevention as well as hemorrhoids.  We used ProctoCream twice daily.  He will follow-up if not improving within the next 2 weeks.  Avoid sitting on the toilet bowl for long periods of time.  Use a stool softener as needed. Information about a low purine diet added.     Mliss Sax, MD

## 2022-04-25 IMAGING — CT CT HEART MORP W/ CTA COR W/ SCORE W/ CA W/CM &/OR W/O CM
4 of 7 series · 8 of 20 positions shown, 9 images · IV contrast (omnipaque)
Comparison: None
COMPARISON: None

Addendum:
EXAM:
OVER-READ INTERPRETATION  CT CHEST

The following report is an over-read performed by radiologist Dr.
Maierhofer Sijak [[email or web address]] on 06/06/2020. This over-read
does not include interpretation of cardiac or coronary anatomy or
pathology. The coronary CTA interpretation by the cardiologist is
attached.
HISTORY: Chest pain, nonspecific
Cardiac/Coronary  CT
TECHNIQUE: The patient was scanned on a Siemens Force scanner.
PROTOCOL: A 120 kV prospective scan was triggered in the descending thoracic
aorta at 111 HU's. Axial non-contrast 3 mm slices were carried out
through the heart. The data set was analyzed on a dedicated work
station and scored using the Agatston method. Gantry rotation speed
was 250 msecs and collimation was .6 mm. Beta blockade and 0.8 mg of
sl NTG was given. The 3D data set was reconstructed in 5% intervals
of the 67-82 % of the R-R cycle. Diastolic phases were analyzed on a
dedicated work station using MPR, MIP and VRT modes. The patient
received 80mL OMNIPAQUE IOHEXOL 350 MG/ML SOLN of contrast.

[Series 6: best diast · axial · 0.39mm/px · z∈[+1191,+1228]mm · 2 of 276 slices shown, 3 images]
[im 92/276  vessel]
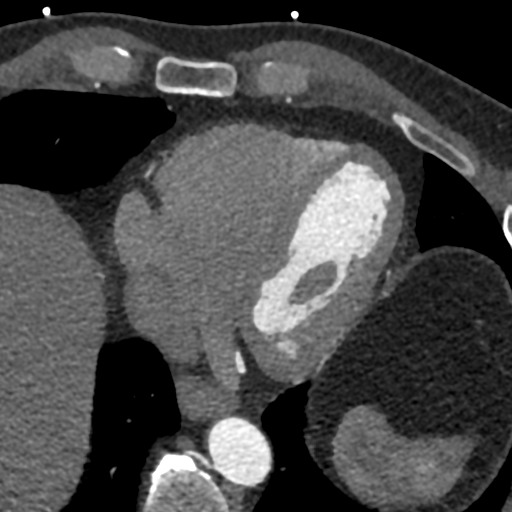
[im 92/276  lung]
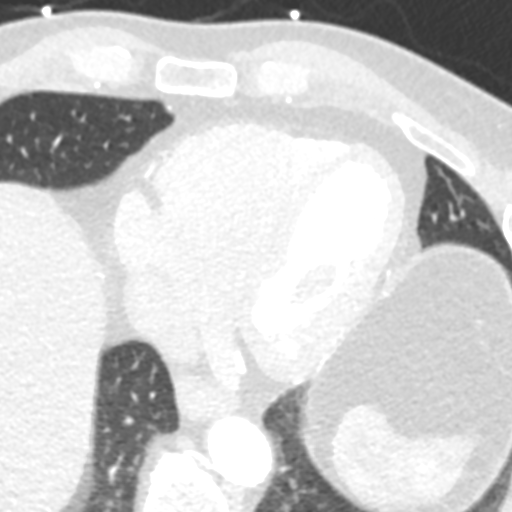
[im 184/276  vessel]
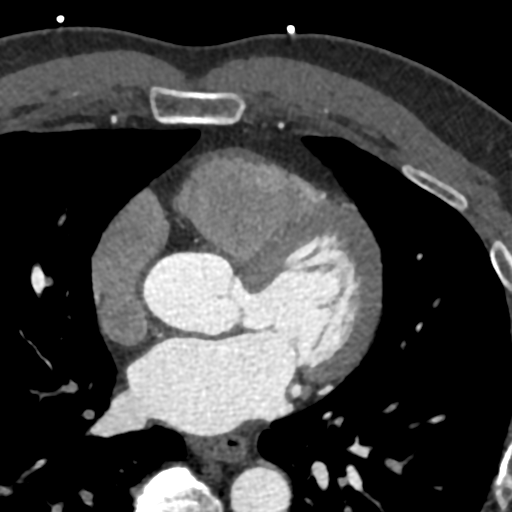

[Series 7: best syst · axial · 0.39mm/px · z∈[+1191,+1228]mm · 2 of 276 slices shown]
[im 92/276  vessel]
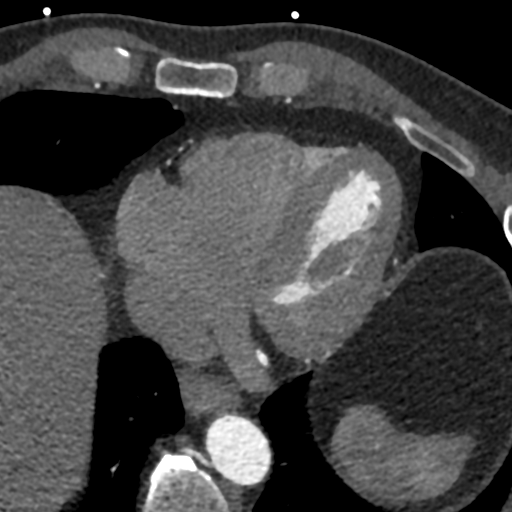
[im 184/276  vessel]
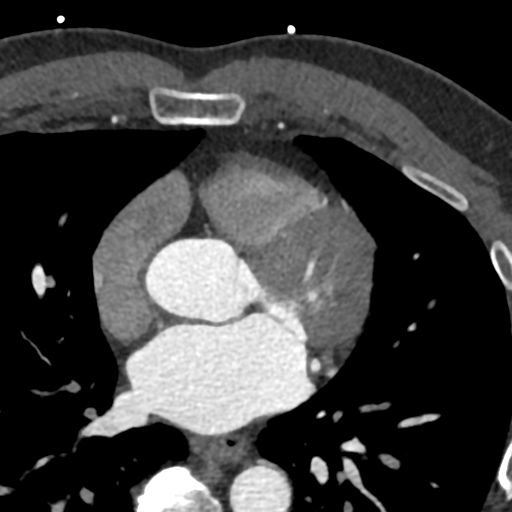

[Series 8: ts diast sharp 75 % · axial · 0.39mm/px · z∈[+1191,+1228]mm · 2 of 276 slices shown]
[im 92/276  lung]
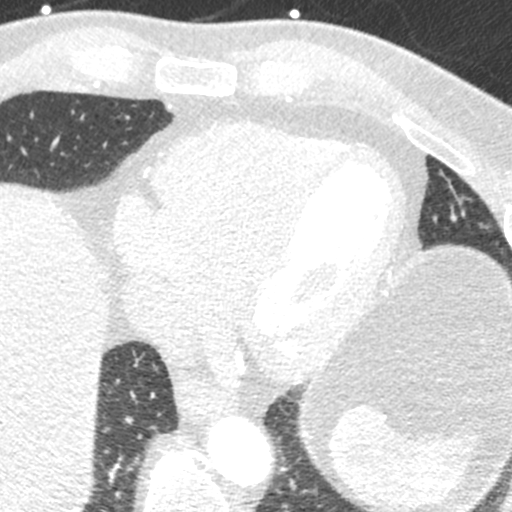
[im 184/276  lung]
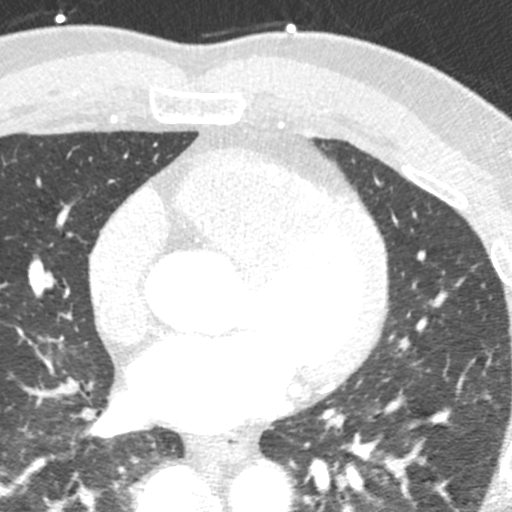

[Series 9: ts syst sharp · axial · 0.39mm/px · z∈[+1191,+1228]mm · 2 of 276 slices shown]
[im 92/276  lung]
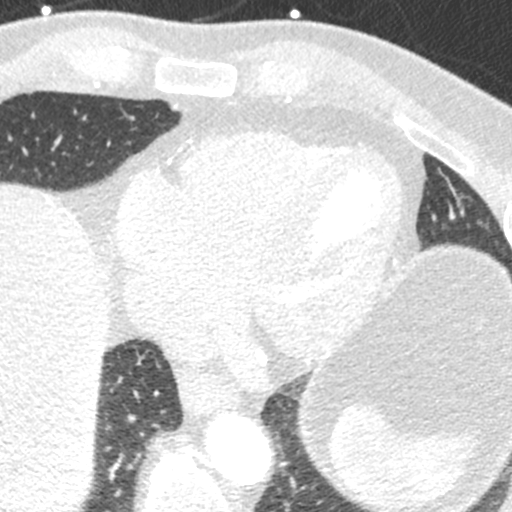
[im 184/276  lung]
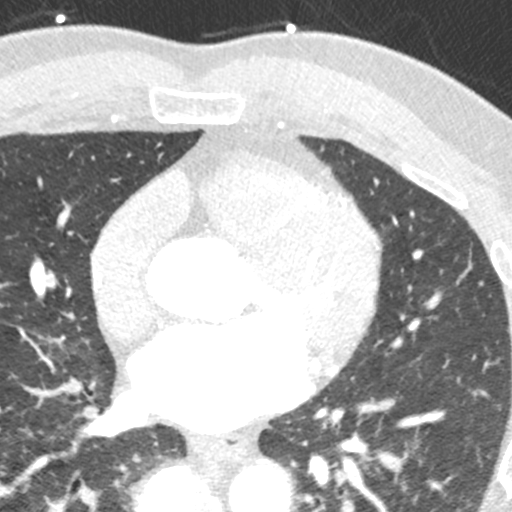

[8 of 20 positions shown; findings below may reference images not displayed]

FINDINGS: Vascular: Heart is normal size.  Aorta normal caliber.

Mediastinum/Nodes: No adenopathy

Lungs/Pleura: No confluent opacities or effusions.

Upper Abdomen: Imaging into the upper abdomen demonstrates no acute
findings.

Musculoskeletal: Chest wall soft tissues are unremarkable. No acute
bony abnormality.
IMPRESSION: No acute or significant extracardiac abnormality.
FINDINGS: Image quality: Good.

Noise artifact is: Limited.

Coronary calcium score is 0.

Coronary arteries: Normal coronary origins.  Left dominance.

Right Coronary Artery: No detectable plaque or stenosis. Small
caliber mid and distal RCA, nondominant.

Left Main Coronary Artery: No detectable plaque or stenosis.

Left Anterior Descending Coronary Artery: No detectable plaque or
stenosis. Mild superficial bridging in the mid-distal LAD.

Left Circumflex Artery: No detectable plaque or stenosis. Large left
circumflex with dominant circulation.

Aorta: Normal size, 29 mm at the mid ascending aorta (level of the
PA bifurcation) measured double oblique. No calcifications. No
dissection.

Aortic Valve: No calcifications.  Tricuspid aortic valve.

Other findings:

Normal pulmonary vein drainage into the left atrium.

Normal left atrial appendage without a thrombus.

Normal size of the pulmonary artery.
IMPRESSION: 1. No evidence of CAD, CADRADS = 0.

2. Coronary calcium score of 0.

3. Normal coronary origin with left dominance.

*** End of Addendum ***
EXAM:
OVER-READ INTERPRETATION  CT CHEST

The following report is an over-read performed by radiologist Dr.
Maierhofer Sijak [REDACTED] on 06/06/2020. This over-read
does not include interpretation of cardiac or coronary anatomy or
pathology. The coronary CTA interpretation by the cardiologist is
attached.
FINDINGS: Vascular: Heart is normal size.  Aorta normal caliber.

Mediastinum/Nodes: No adenopathy

Lungs/Pleura: No confluent opacities or effusions.

Upper Abdomen: Imaging into the upper abdomen demonstrates no acute
findings.

Musculoskeletal: Chest wall soft tissues are unremarkable. No acute
bony abnormality.
IMPRESSION: No acute or significant extracardiac abnormality.

## 2022-05-17 ENCOUNTER — Other Ambulatory Visit: Payer: Self-pay | Admitting: Family Medicine

## 2022-05-17 DIAGNOSIS — F419 Anxiety disorder, unspecified: Secondary | ICD-10-CM

## 2022-08-11 ENCOUNTER — Other Ambulatory Visit: Payer: Self-pay | Admitting: Family Medicine

## 2022-08-11 DIAGNOSIS — E7800 Pure hypercholesterolemia, unspecified: Secondary | ICD-10-CM

## 2022-10-20 ENCOUNTER — Encounter: Payer: Self-pay | Admitting: Family Medicine

## 2022-10-20 ENCOUNTER — Ambulatory Visit (INDEPENDENT_AMBULATORY_CARE_PROVIDER_SITE_OTHER): Payer: 59 | Admitting: Family Medicine

## 2022-10-20 VITALS — BP 122/80 | HR 86 | Temp 98.1°F | Ht 69.0 in | Wt 203.0 lb

## 2022-10-20 DIAGNOSIS — F419 Anxiety disorder, unspecified: Secondary | ICD-10-CM | POA: Insufficient documentation

## 2022-10-20 DIAGNOSIS — Z0000 Encounter for general adult medical examination without abnormal findings: Secondary | ICD-10-CM | POA: Diagnosis not present

## 2022-10-20 DIAGNOSIS — M5136 Other intervertebral disc degeneration, lumbar region: Secondary | ICD-10-CM | POA: Diagnosis not present

## 2022-10-20 DIAGNOSIS — Z8739 Personal history of other diseases of the musculoskeletal system and connective tissue: Secondary | ICD-10-CM

## 2022-10-20 DIAGNOSIS — E7800 Pure hypercholesterolemia, unspecified: Secondary | ICD-10-CM

## 2022-10-20 LAB — COMPREHENSIVE METABOLIC PANEL
ALT: 28 U/L (ref 0–53)
AST: 27 U/L (ref 0–37)
Albumin: 4.5 g/dL (ref 3.5–5.2)
Alkaline Phosphatase: 42 U/L (ref 39–117)
BUN: 15 mg/dL (ref 6–23)
CO2: 27 mEq/L (ref 19–32)
Calcium: 9.6 mg/dL (ref 8.4–10.5)
Chloride: 102 mEq/L (ref 96–112)
Creatinine, Ser: 1.07 mg/dL (ref 0.40–1.50)
GFR: 74.96 mL/min (ref >60.00)
Glucose, Bld: 92 mg/dL (ref 70–99)
Potassium: 5 mEq/L (ref 3.5–5.1)
Sodium: 136 mEq/L (ref 135–145)
Total Bilirubin: 1 mg/dL (ref 0.2–1.2)
Total Protein: 8.2 g/dL (ref 6.0–8.3)

## 2022-10-20 LAB — LIPID PANEL
Cholesterol: 169 mg/dL (ref 0–200)
HDL: 48.7 mg/dL (ref >39.00)
LDL Cholesterol: 101 mg/dL — ABNORMAL HIGH (ref 0–99)
NonHDL: 120.06
Total CHOL/HDL Ratio: 3
Triglycerides: 94 mg/dL (ref 0.0–149.0)
VLDL: 18.8 mg/dL (ref 0.0–40.0)

## 2022-10-20 LAB — CBC
HCT: 44 % (ref 39.0–52.0)
Hemoglobin: 14.6 g/dL (ref 13.0–17.0)
MCHC: 33.3 g/dL (ref 30.0–36.0)
MCV: 86.6 fl (ref 78.0–100.0)
Platelets: 238 10*3/uL (ref 150.0–400.0)
RBC: 5.08 Mil/uL (ref 4.22–5.81)
RDW: 13 % (ref 11.5–15.5)
WBC: 4.8 10*3/uL (ref 4.0–10.5)

## 2022-10-20 LAB — URINALYSIS, ROUTINE W REFLEX MICROSCOPIC
Bilirubin Urine: NEGATIVE
Hgb urine dipstick: NEGATIVE
Ketones, ur: NEGATIVE
Leukocytes,Ua: NEGATIVE
Nitrite: NEGATIVE
Specific Gravity, Urine: 1.01 (ref 1.000–1.030)
Total Protein, Urine: NEGATIVE
Urine Glucose: NEGATIVE
Urobilinogen, UA: 0.2 (ref 0.0–1.0)
WBC, UA: NONE SEEN (ref 0–?)
pH: 6.5 (ref 5.0–8.0)

## 2022-10-20 LAB — URIC ACID: Uric Acid, Serum: 8.7 mg/dL — ABNORMAL HIGH (ref 4.0–7.8)

## 2022-10-20 LAB — PSA: PSA: 0.55 ng/mL (ref 0.10–4.00)

## 2022-10-20 NOTE — Progress Notes (Signed)
Established Patient Office Visit   Subjective:  Patient ID: Spencer Medina, male    DOB: [birth year]  Age: 62 y.o. MRN: [id number]  Chief Complaint  Patient presents with   Annual Exam    CPE, no concerns. Patient fasting     HPI Encounter Diagnoses  Name Primary?   Elevated cholesterol Yes   Anxiety    History of gout    Healthcare maintenance    Degeneration of lumbar intervertebral disc    Continues to do well.  Quite active on his job.  Still achieves 10-15,000 steps daily.  Has regular dental care.  He lives at home with his wife.  He has 1 son who is 81 years old.  He is seeing a chiropractor lower back pain.  This is helping.  Has not had any gouty attacks over this past year.  He has been using a homeopathic supplement to lower his uric acid.  He has been on a low purine diet.  Continues with BuSpar.  He is interested in possibly trying a taper to see how things will go without it.  Continues to do well with atorvastatin.  His mother also had high cholesterol.  His 10-year risk score has been low.  Up-to-date on health maintenance.   Review of Systems  Constitutional: Negative.   HENT: Negative.    Eyes:  Negative for blurred vision, discharge and redness.  Respiratory: Negative.    Cardiovascular: Negative.   Gastrointestinal:  Negative for abdominal pain.  Genitourinary: Negative.   Musculoskeletal:  Positive for back pain. Negative for myalgias.  Skin:  Negative for rash.  Neurological:  Negative for tingling, loss of consciousness and weakness.  Endo/Heme/Allergies:  Negative for polydipsia.      10/20/2022    8:00 AM 10/15/2021    8:02 AM 07/31/2021    4:15 PM  Depression screen PHQ 2/9  Decreased Interest 0 0   Down, Depressed, Hopeless 0 0 0  PHQ - 2 Score 0 0 0  Altered sleeping 0  0  Tired, decreased energy 0  0  Change in appetite 0  0  Feeling bad or failure about yourself  0  0  Trouble concentrating 0  0  Moving slowly or fidgety/restless 0  0   Suicidal thoughts 0  0  PHQ-9 Score 0  0  Difficult doing work/chores Not difficult at all  Not difficult at all       Current Outpatient Medications:    atorvastatin (LIPITOR) 20 MG tablet, TAKE 1 TABLET BY MOUTH EVERY DAY, Disp: 30 tablet, Rfl: 11   busPIRone (BUSPAR) 7.5 MG tablet, TAKE 1 TABLET BY MOUTH 2 TIMES DAILY., Disp: 180 tablet, Rfl: 1   Cholecalciferol (VITAMIN D3) 1.25 MG (50000 UT) TABS, Take by mouth., Disp: , Rfl:    colchicine 0.6 MG tablet, , Disp: , Rfl:    Cyanocobalamin (VITAMIN B 12 PO), Take by mouth., Disp: , Rfl:    diphenhydrAMINE HCl, Sleep, (SLEEP-AID) 50 MG CAPS, , Disp: , Rfl:    fluticasone (FLONASE) 50 MCG/ACT nasal spray, Place into both nostrils daily., Disp: , Rfl:    Misc Natural Products (JOINT HEALTH) CAPS, , Disp: , Rfl:    Saw Palmetto 500 MG CAPS, , Disp: , Rfl:    Objective:     BP 122/80 (BP Location: Left Arm, Patient Position: Sitting, Cuff Size: Normal)   Pulse 86   Temp 98.1 F (36.7 C) (Temporal)   Ht 5\' 9"  (1.753 m)  Wt 203 lb (92.1 kg)   SpO2 97%   BMI 29.98 kg/m    Physical Exam Constitutional:      General: He is not in acute distress.    Appearance: Normal appearance. He is not ill-appearing, toxic-appearing or diaphoretic.  HENT:     Head: Normocephalic and atraumatic.     Right Ear: Tympanic membrane, ear canal and external ear normal.     Left Ear: Tympanic membrane, ear canal and external ear normal.     Mouth/Throat:     Mouth: Mucous membranes are moist.     Pharynx: Oropharynx is clear. No oropharyngeal exudate or posterior oropharyngeal erythema.  Eyes:     General: No scleral icterus.       Right eye: No discharge.        Left eye: No discharge.     Extraocular Movements: Extraocular movements intact.     Conjunctiva/sclera: Conjunctivae normal.     Pupils: Pupils are equal, round, and reactive to light.  Cardiovascular:     Rate and Rhythm: Normal rate and regular rhythm.  Pulmonary:     Effort:  Pulmonary effort is normal. No respiratory distress.     Breath sounds: Normal breath sounds.  Abdominal:     General: Bowel sounds are normal.     Tenderness: There is no abdominal tenderness. There is no guarding or rebound.     Hernia: No hernia is present.  Musculoskeletal:     Cervical back: No rigidity or tenderness.  Lymphadenopathy:     Cervical: No cervical adenopathy.  Skin:    General: Skin is warm and dry.  Neurological:     Mental Status: He is alert and oriented to person, place, and time.  Psychiatric:        Mood and Affect: Mood normal.        Behavior: Behavior normal.      No results found for any visits on 10/20/22.    The 10-year ASCVD risk score (Arnett DK, et al., 2019) is: 7.6%    Assessment & Plan:   Elevated cholesterol -     Comprehensive metabolic panel -     Lipid panel  Anxiety  History of gout -     Comprehensive metabolic panel -     Uric acid  Healthcare maintenance -     CBC -     PSA -     Urinalysis, Routine w reflex microscopic  Degeneration of lumbar intervertebral disc    No follow-ups on file.  Continue active healthy lifestyle.  Information given on health maintenance and disease prevention.  Given information about managing hyperlipidemia.  Will continue atorvastatin.  Information given about a low purine diet.  Will send in colchicine as needed.  Discussed using allopurinol with increasing frequency of attacks.  Discussed BuSpar taper over 6 weeks.  Mliss Sax, MD

## 2022-11-05 ENCOUNTER — Other Ambulatory Visit: Payer: Self-pay | Admitting: Family Medicine

## 2022-11-05 DIAGNOSIS — F419 Anxiety disorder, unspecified: Secondary | ICD-10-CM

## 2022-12-01 ENCOUNTER — Other Ambulatory Visit: Payer: Self-pay | Admitting: Family Medicine

## 2022-12-01 DIAGNOSIS — F419 Anxiety disorder, unspecified: Secondary | ICD-10-CM

## 2023-05-27 ENCOUNTER — Telehealth: Payer: Self-pay | Admitting: Family Medicine

## 2023-05-27 NOTE — Telephone Encounter (Signed)
 ERROR

## 2023-06-19 ENCOUNTER — Other Ambulatory Visit: Payer: Self-pay | Admitting: Family Medicine

## 2023-06-19 DIAGNOSIS — F419 Anxiety disorder, unspecified: Secondary | ICD-10-CM

## 2023-08-07 ENCOUNTER — Other Ambulatory Visit: Payer: Self-pay | Admitting: Family Medicine

## 2023-08-07 DIAGNOSIS — E7800 Pure hypercholesterolemia, unspecified: Secondary | ICD-10-CM

## 2023-10-26 ENCOUNTER — Ambulatory Visit: Payer: 59 | Admitting: Family Medicine

## 2023-10-27 ENCOUNTER — Ambulatory Visit: Payer: 59 | Admitting: Family Medicine

## 2023-10-28 ENCOUNTER — Ambulatory Visit (INDEPENDENT_AMBULATORY_CARE_PROVIDER_SITE_OTHER): Payer: 59 | Admitting: Family Medicine

## 2023-10-28 ENCOUNTER — Encounter: Payer: Self-pay | Admitting: Family Medicine

## 2023-10-28 VITALS — BP 122/80 | HR 57 | Temp 97.4°F | Ht 69.0 in | Wt 206.4 lb

## 2023-10-28 DIAGNOSIS — E7800 Pure hypercholesterolemia, unspecified: Secondary | ICD-10-CM

## 2023-10-28 DIAGNOSIS — E538 Deficiency of other specified B group vitamins: Secondary | ICD-10-CM | POA: Diagnosis not present

## 2023-10-28 DIAGNOSIS — Z125 Encounter for screening for malignant neoplasm of prostate: Secondary | ICD-10-CM | POA: Diagnosis not present

## 2023-10-28 DIAGNOSIS — Z0000 Encounter for general adult medical examination without abnormal findings: Secondary | ICD-10-CM

## 2023-10-28 DIAGNOSIS — Z131 Encounter for screening for diabetes mellitus: Secondary | ICD-10-CM

## 2023-10-28 DIAGNOSIS — E559 Vitamin D deficiency, unspecified: Secondary | ICD-10-CM | POA: Diagnosis not present

## 2023-10-28 DIAGNOSIS — F419 Anxiety disorder, unspecified: Secondary | ICD-10-CM | POA: Diagnosis not present

## 2023-10-28 DIAGNOSIS — Z8739 Personal history of other diseases of the musculoskeletal system and connective tissue: Secondary | ICD-10-CM

## 2023-10-28 LAB — COMPREHENSIVE METABOLIC PANEL WITH GFR
ALT: 30 U/L (ref 0–53)
AST: 25 U/L (ref 0–37)
Albumin: 4.4 g/dL (ref 3.5–5.2)
Alkaline Phosphatase: 50 U/L (ref 39–117)
BUN: 12 mg/dL (ref 6–23)
CO2: 27 meq/L (ref 19–32)
Calcium: 9.3 mg/dL (ref 8.4–10.5)
Chloride: 105 meq/L (ref 96–112)
Creatinine, Ser: 1 mg/dL (ref 0.40–1.50)
GFR: 80.72 mL/min (ref >60.00)
Glucose, Bld: 94 mg/dL (ref 70–99)
Potassium: 4.3 meq/L (ref 3.5–5.1)
Sodium: 139 meq/L (ref 135–145)
Total Bilirubin: 0.9 mg/dL (ref 0.2–1.2)
Total Protein: 7.9 g/dL (ref 6.0–8.3)

## 2023-10-28 LAB — CBC WITH DIFFERENTIAL/PLATELET
Basophils Absolute: 0 10*3/uL (ref 0.0–0.1)
Basophils Relative: 0.6 % (ref 0.0–3.0)
Eosinophils Absolute: 0.2 10*3/uL (ref 0.0–0.7)
Eosinophils Relative: 3.7 % (ref 0.0–5.0)
HCT: 44.2 % (ref 39.0–52.0)
Hemoglobin: 14.8 g/dL (ref 13.0–17.0)
Lymphocytes Relative: 39.6 % (ref 12.0–46.0)
Lymphs Abs: 2.1 10*3/uL (ref 0.7–4.0)
MCHC: 33.6 g/dL (ref 30.0–36.0)
MCV: 86.6 fl (ref 78.0–100.0)
Monocytes Absolute: 0.7 10*3/uL (ref 0.1–1.0)
Monocytes Relative: 13.1 % — ABNORMAL HIGH (ref 3.0–12.0)
Neutro Abs: 2.2 10*3/uL (ref 1.4–7.7)
Neutrophils Relative %: 43 % (ref 43.0–77.0)
Platelets: 232 10*3/uL (ref 150.0–400.0)
RBC: 5.1 Mil/uL (ref 4.22–5.81)
RDW: 13.6 % (ref 11.5–15.5)
WBC: 5.2 10*3/uL (ref 4.0–10.5)

## 2023-10-28 LAB — VITAMIN B12: Vitamin B-12: >1500 pg/mL — ABNORMAL HIGH (ref 211–911)

## 2023-10-28 LAB — URINALYSIS, ROUTINE W REFLEX MICROSCOPIC
Bilirubin Urine: NEGATIVE
Ketones, ur: NEGATIVE
Leukocytes,Ua: NEGATIVE
Nitrite: NEGATIVE
Specific Gravity, Urine: 1.02 (ref 1.000–1.030)
Total Protein, Urine: NEGATIVE
Urine Glucose: NEGATIVE
Urobilinogen, UA: 0.2 (ref 0.0–1.0)
pH: 6 (ref 5.0–8.0)

## 2023-10-28 LAB — LIPID PANEL
Cholesterol: 170 mg/dL (ref 0–200)
HDL: 47 mg/dL (ref >39.00)
LDL Cholesterol: 105 mg/dL — ABNORMAL HIGH (ref 0–99)
NonHDL: 123.33
Total CHOL/HDL Ratio: 4
Triglycerides: 94 mg/dL (ref 0.0–149.0)
VLDL: 18.8 mg/dL (ref 0.0–40.0)

## 2023-10-28 LAB — URIC ACID: Uric Acid, Serum: 7.6 mg/dL (ref 4.0–7.8)

## 2023-10-28 LAB — PSA: PSA: 0.42 ng/mL (ref 0.10–4.00)

## 2023-10-28 LAB — HEMOGLOBIN A1C: Hgb A1c MFr Bld: 5.8 % (ref 4.6–6.5)

## 2023-10-28 LAB — VITAMIN D 25 HYDROXY (VIT D DEFICIENCY, FRACTURES): VITD: 33.25 ng/mL (ref 30.00–100.00)

## 2023-10-28 NOTE — Progress Notes (Signed)
 Established Patient Office Visit   Subjective:  Patient ID: Spencer Medina, male    DOB: 1960-08-16  Age: 63 y.o. MRN: [id number]  Chief Complaint  Patient presents with   Annual Exam    CPE. Pt is fasting.     HPI Encounter Diagnoses  Name Primary?   Healthcare maintenance Yes   Anxiety    History of gout    Vitamin D deficiency    Vitamin B 12 deficiency    Elevated cholesterol    Screening for prostate cancer    Screening for diabetes mellitus    Here for physical.  Fasting.  Continues to exercise by walking.  He has regular dental care.  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  He is actually enjoying it.  Continues chiropractic care for chronic back pain.  This is helpful for him.  He has not experienced any gouty attacks over the past year.  Continues BuSpar  as needed for anxiety.   Review of Systems  Constitutional: Negative.   HENT: Negative.    Eyes:  Negative for blurred vision, discharge and redness.  Respiratory: Negative.    Cardiovascular: Negative.   Gastrointestinal:  Negative for abdominal pain.  Genitourinary: Negative.   Musculoskeletal:  Positive for back pain. Negative for myalgias.  Skin:  Negative for rash.  Neurological:  Negative for tingling, loss of consciousness and weakness.  Endo/Heme/Allergies:  Negative for polydipsia.  Psychiatric/Behavioral:  The patient is nervous/anxious.       10/28/2023    8:49 AM 10/20/2022    8:00 AM 10/15/2021    8:02 AM  Depression screen PHQ 2/9  Decreased Interest 0 0 0  Down, Depressed, Hopeless 0 0 0  PHQ - 2 Score 0 0 0  Altered sleeping 0 0   Tired, decreased energy 0 0   Change in appetite 0 0   Feeling bad or failure about yourself  0 0   Trouble concentrating 0 0   Moving slowly or fidgety/restless 0 0   Suicidal thoughts 0 0   PHQ-9 Score 0 0   Difficult doing work/chores Not difficult at all Not difficult at all       Current Outpatient  Medications:    atorvastatin  (LIPITOR) 20 MG tablet, TAKE 1 TABLET BY MOUTH EVERY DAY, Disp: 30 tablet, Rfl: 11   busPIRone  (BUSPAR ) 7.5 MG tablet, TAKE 1 TABLET BY MOUTH TWICE A DAY, Disp: 60 tablet, Rfl: 5   Cholecalciferol (VITAMIN D3) 1.25 MG (50000 UT) TABS, Take by mouth., Disp: , Rfl:    colchicine 0.6 MG tablet, , Disp: , Rfl:    Cyanocobalamin (VITAMIN B 12 PO), Take by mouth., Disp: , Rfl:    diphenhydrAMINE HCl, Sleep, (SLEEP-AID) 50 MG CAPS, , Disp: , Rfl:    fluticasone (FLONASE) 50 MCG/ACT nasal spray, Place into both nostrils daily., Disp: , Rfl:    Saw Palmetto 500 MG CAPS, , Disp: , Rfl:    Objective:     BP 122/80 (Cuff Size: Large)   Pulse (!) 57   Temp (!) 97.4 F (36.3 C) (Temporal)   Ht 5' 9 (1.753 m)   Wt 206 lb 6.4 oz (93.6 kg)   SpO2 97%   BMI 30.48 kg/m    Physical Exam Constitutional:      General: He is not in acute distress.    Appearance: Normal appearance. He is not ill-appearing, toxic-appearing or diaphoretic.  HENT:  Head: Normocephalic and atraumatic.     Right Ear: Tympanic membrane, ear canal and external ear normal.     Left Ear: Tympanic membrane, ear canal and external ear normal.     Mouth/Throat:     Mouth: Mucous membranes are moist.     Pharynx: Oropharynx is clear. No oropharyngeal exudate or posterior oropharyngeal erythema.   Eyes:     General: No scleral icterus.       Right eye: No discharge.        Left eye: No discharge.     Extraocular Movements: Extraocular movements intact.     Conjunctiva/sclera: Conjunctivae normal.     Pupils: Pupils are equal, round, and reactive to light.    Cardiovascular:     Rate and Rhythm: Normal rate and regular rhythm.  Pulmonary:     Effort: Pulmonary effort is normal. No respiratory distress.     Breath sounds: Normal breath sounds. No wheezing or rales.  Abdominal:     General: Bowel sounds are normal.     Tenderness: There is no abdominal tenderness. There is no guarding.      Hernia: There is no hernia in the left inguinal area or right inguinal area.  Genitourinary:    Penis: Circumcised. No hypospadias, erythema, tenderness, discharge, swelling or lesions.      Testes:        Right: Mass, tenderness or swelling not present. Right testis is descended.        Left: Mass, tenderness or swelling not present. Left testis is descended.     Epididymis:     Right: Not inflamed or enlarged.     Left: Not inflamed or enlarged.   Musculoskeletal:     Cervical back: No rigidity or tenderness.  Lymphadenopathy:     Cervical: No cervical adenopathy.     Lower Body: No right inguinal adenopathy. No left inguinal adenopathy.   Skin:    General: Skin is warm and dry.   Neurological:     Mental Status: He is alert and oriented to person, place, and time.   Psychiatric:        Mood and Affect: Mood normal.        Behavior: Behavior normal.      No results found for any visits on 10/28/23.    The 10-year ASCVD risk score (Arnett DK, et al., 2019) is: 8.3%    Assessment & Plan:   Healthcare maintenance -     CBC with Differential/Platelet -     Urinalysis, Routine w reflex microscopic  Anxiety  History of gout -     Comprehensive metabolic panel with GFR -     Uric acid  Vitamin D deficiency -     VITAMIN D 25 Hydroxy (Vit-D Deficiency, Fractures)  Vitamin B 12 deficiency -     Vitamin B12  Elevated cholesterol -     Comprehensive metabolic panel with GFR -     Lipid panel  Screening for prostate cancer -     PSA  Screening for diabetes mellitus -     Comprehensive metabolic panel with GFR -     Hemoglobin A1c    Return in about 1 year (around 10/27/2024), or if symptoms worsen or fail to improve.  Continue all current medications.  Continue healthy active lifestyle.  Information was given on health maintenance and disease prevention.  Will continue to monitor for gouty attacks and if they increase he will RTC.  Tonna Frederic,  MD

## 2023-10-31 ENCOUNTER — Ambulatory Visit: Payer: Self-pay | Admitting: Family Medicine

## 2023-12-17 ENCOUNTER — Other Ambulatory Visit: Payer: Self-pay | Admitting: Family Medicine

## 2023-12-17 DIAGNOSIS — F419 Anxiety disorder, unspecified: Secondary | ICD-10-CM

## 2024-06-15 ENCOUNTER — Other Ambulatory Visit: Payer: Self-pay | Admitting: Family Medicine

## 2024-06-15 DIAGNOSIS — F419 Anxiety disorder, unspecified: Secondary | ICD-10-CM

## 2024-10-30 ENCOUNTER — Encounter: Admitting: Family Medicine
